# Patient Record
Sex: Male | Born: 1985 | Race: White | Hispanic: No | Marital: Single | Smoking: Current every day smoker
Health system: Southern US, Community
[De-identification: ages and names within clinical notes are randomized; demographics above are authoritative.]

## PROBLEM LIST (undated history)

## (undated) DIAGNOSIS — F329 Major depressive disorder, single episode, unspecified: Secondary | ICD-10-CM

## (undated) DIAGNOSIS — F32A Depression, unspecified: Secondary | ICD-10-CM

## (undated) HISTORY — PX: WISDOM TOOTH EXTRACTION: SHX21

---

## 2015-09-22 ENCOUNTER — Emergency Department (HOSPITAL_COMMUNITY): Payer: Medicare Other

## 2015-09-22 ENCOUNTER — Encounter (HOSPITAL_COMMUNITY): Payer: Self-pay | Admitting: Emergency Medicine

## 2015-09-22 ENCOUNTER — Emergency Department (HOSPITAL_COMMUNITY)
Admission: EM | Admit: 2015-09-22 | Discharge: 2015-09-22 | Disposition: A | Discharge status: Home / Self Care | Payer: Medicare Other | Attending: Emergency Medicine | Admitting: Emergency Medicine

## 2015-09-22 DIAGNOSIS — S199XXA Unspecified injury of neck, initial encounter: Secondary | ICD-10-CM | POA: Insufficient documentation

## 2015-09-22 DIAGNOSIS — F329 Major depressive disorder, single episode, unspecified: Secondary | ICD-10-CM | POA: Insufficient documentation

## 2015-09-22 DIAGNOSIS — Y92410 Unspecified street and highway as the place of occurrence of the external cause: Secondary | ICD-10-CM | POA: Diagnosis not present

## 2015-09-22 DIAGNOSIS — Z79899 Other long term (current) drug therapy: Secondary | ICD-10-CM | POA: Insufficient documentation

## 2015-09-22 DIAGNOSIS — Y9389 Activity, other specified: Secondary | ICD-10-CM | POA: Insufficient documentation

## 2015-09-22 DIAGNOSIS — S29001A Unspecified injury of muscle and tendon of front wall of thorax, initial encounter: Secondary | ICD-10-CM | POA: Insufficient documentation

## 2015-09-22 DIAGNOSIS — S4992XA Unspecified injury of left shoulder and upper arm, initial encounter: Secondary | ICD-10-CM | POA: Insufficient documentation

## 2015-09-22 DIAGNOSIS — F17210 Nicotine dependence, cigarettes, uncomplicated: Secondary | ICD-10-CM | POA: Insufficient documentation

## 2015-09-22 DIAGNOSIS — Y998 Other external cause status: Secondary | ICD-10-CM | POA: Insufficient documentation

## 2015-09-22 DIAGNOSIS — M25512 Pain in left shoulder: Secondary | ICD-10-CM

## 2015-09-22 HISTORY — DX: Major depressive disorder, single episode, unspecified: F32.9

## 2015-09-22 HISTORY — DX: Depression, unspecified: F32.A

## 2015-09-22 LAB — I-STAT CHEM 8, ED
BUN: 12 mg/dL (ref 6–20)
CHLORIDE: 104 mmol/L (ref 101–111)
CREATININE: 0.9 mg/dL (ref 0.61–1.24)
Calcium, Ion: 1.19 mmol/L (ref 1.12–1.23)
GLUCOSE: 120 mg/dL — AB (ref 65–99)
HCT: 49 % (ref 39.0–52.0)
HEMOGLOBIN: 16.7 g/dL (ref 13.0–17.0)
POTASSIUM: 3.7 mmol/L (ref 3.5–5.1)
Sodium: 143 mmol/L (ref 135–145)
TCO2: 25 mmol/L (ref 0–100)

## 2015-09-22 LAB — ETHANOL: Alcohol, Ethyl (B): <5 mg/dL (ref <5)

## 2015-09-22 MED ORDER — IBUPROFEN 600 MG PO TABS: 600.0000 mg | ORAL_TABLET | Freq: Three times a day (TID) | ORAL | Status: AC | PRN

## 2015-09-22 NOTE — Discharge Instructions (Signed)

## 2015-09-22 NOTE — ED Notes (Signed)
Placed c-collar back on pt. Pt continues to ask everyone if he can take it back off.
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
## 2015-09-22 NOTE — ED Notes (Signed)
Pt to xray
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
